# Patient Record
Sex: Male | Born: 1995 | Race: Black or African American | Hispanic: No | Marital: Single | Smoking: Former smoker
Health system: Southern US, Community
[De-identification: ages and names within clinical notes are randomized; demographics above are authoritative.]

## PROBLEM LIST (undated history)

## (undated) DIAGNOSIS — S43005D Unspecified dislocation of left shoulder joint, subsequent encounter: Secondary | ICD-10-CM

---
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
## 2017-04-10 ENCOUNTER — Encounter: Payer: Self-pay | Admitting: Emergency Medicine

## 2017-04-10 ENCOUNTER — Emergency Department
Admission: EM | Admit: 2017-04-10 | Discharge: 2017-04-10 | Disposition: A | Discharge status: Home / Self Care | Payer: BLUE CROSS/BLUE SHIELD | Attending: Emergency Medicine | Admitting: Emergency Medicine

## 2017-04-10 ENCOUNTER — Emergency Department: Payer: BLUE CROSS/BLUE SHIELD

## 2017-04-10 DIAGNOSIS — T1490XA Injury, unspecified, initial encounter: Secondary | ICD-10-CM

## 2017-04-10 DIAGNOSIS — Y929 Unspecified place or not applicable: Secondary | ICD-10-CM | POA: Insufficient documentation

## 2017-04-10 DIAGNOSIS — Y999 Unspecified external cause status: Secondary | ICD-10-CM | POA: Diagnosis not present

## 2017-04-10 DIAGNOSIS — X58XXXA Exposure to other specified factors, initial encounter: Secondary | ICD-10-CM | POA: Diagnosis not present

## 2017-04-10 DIAGNOSIS — S43015A Anterior dislocation of left humerus, initial encounter: Secondary | ICD-10-CM | POA: Diagnosis not present

## 2017-04-10 DIAGNOSIS — Y9367 Activity, basketball: Secondary | ICD-10-CM | POA: Insufficient documentation

## 2017-04-10 DIAGNOSIS — M21822 Other specified acquired deformities of left upper arm: Secondary | ICD-10-CM

## 2017-04-10 DIAGNOSIS — Z87891 Personal history of nicotine dependence: Secondary | ICD-10-CM | POA: Diagnosis not present

## 2017-04-10 DIAGNOSIS — S4992XA Unspecified injury of left shoulder and upper arm, initial encounter: Secondary | ICD-10-CM | POA: Diagnosis present

## 2017-04-10 DIAGNOSIS — S43005A Unspecified dislocation of left shoulder joint, initial encounter: Secondary | ICD-10-CM

## 2017-04-10 HISTORY — DX: Unspecified dislocation of left shoulder joint, subsequent encounter: S43.005D

## 2017-04-10 MED ORDER — SODIUM CHLORIDE 0.9 % IV BOLUS (SEPSIS)
1000.0000 mL | Freq: Once | INTRAVENOUS | Status: AC
Start: 2017-04-10 — End: 2017-04-10
  Administered 2017-04-10: 1000 mL via INTRAVENOUS

## 2017-04-10 MED ORDER — LIDOCAINE HCL (PF) 1 % IJ SOLN
30.0000 mL | Freq: Once | INTRAMUSCULAR | Status: DC
  Filled 2017-04-10: qty 30

## 2017-04-10 MED ORDER — HYDROMORPHONE HCL 1 MG/ML IJ SOLN
2.0000 mg | Freq: Once | INTRAMUSCULAR | Status: AC
  Administered 2017-04-10: 2 mg via INTRAMUSCULAR
  Filled 2017-04-10: qty 2

## 2017-04-10 MED ORDER — ONDANSETRON HCL 4 MG/2ML IJ SOLN
INTRAMUSCULAR | Status: AC
  Filled 2017-04-10: qty 2

## 2017-04-10 MED ORDER — PROPOFOL 1000 MG/100ML IV EMUL
INTRAVENOUS | Status: AC
  Filled 2017-04-10: qty 100

## 2017-04-10 MED ORDER — PROPOFOL 10 MG/ML IV BOLUS
INTRAVENOUS | Status: AC | PRN
Start: 2017-04-10 — End: 2017-04-10
  Administered 2017-04-10 (×2): 80 mg via INTRAVENOUS

## 2017-04-10 MED ORDER — MORPHINE SULFATE (PF) 4 MG/ML IV SOLN
INTRAVENOUS | Status: AC
  Filled 2017-04-10: qty 1

## 2017-04-10 MED ORDER — PROPOFOL 10 MG/ML IV BOLUS: 1.0000 mg/kg | Freq: Once | INTRAVENOUS | Status: DC
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
## 2017-04-10 NOTE — ED Provider Notes (Signed)
[facility] Emergency Department Provider Note  ____________________________________________   First MD Initiated Contact with Patient 04/10/17 1605     (approximate)  I have reviewed the triage vital signs and the nursing notes.   HISTORY  Chief Complaint Shoulder Pain    HPI Casey Byrd is a 21 y.o. male who comes to the emergency department with severe stabbing aching nonradiating left shoulder pain for the past hour after playing basketball feeling his left shoulder fall out of joint. 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 He is right-hand dominant. He denies numbness or weakness.   Past Medical History:  Diagnosis Date  . Shoulder dislocation, left, subsequent encounter     There are no active problems to display for this patient.   Past Surgical History:  Procedure Laterality Date  . SHOULDER CLOSED REDUCTION Left 09/26/2016   Procedure: CLOSED REDUCTION SHOULDER;  Surgeon: Deeann Saint, MD;  Location: [facility] ORS;  Service: Orthopedics;  Laterality: Left;    Prior to Admission medications   Medication Sig Start Date End Date Taking? Authorizing Provider  HYDROcodone-acetaminophen (NORCO) 7.5-325 MG tablet Take 1 tablet by mouth every 6 (six) hours as needed for moderate pain. 09/26/16   Deeann Saint, MD    Allergies Patient has no known allergies.  No family history on file.  Social History Social History  Substance Use Topics  . Smoking status: Former Games developer  . Smokeless tobacco: Never Used  . Alcohol use No    Review of Systems Constitutional: No fever/chills Eyes: No visual changes. ENT: No sore throat. Cardiovascular: Denies chest pain. Respiratory: Denies shortness of breath. Gastrointestinal: No abdominal pain.  No nausea, no vomiting.  No diarrhea.  No constipation. Genitourinary: Negative for dysuria. Musculoskeletal: Negative for back pain. Skin:  Negative for rash. Neurological: Negative for headaches, focal weakness or numbness.  10-point ROS otherwise negative.  ____________________________________________   PHYSICAL EXAM:  VITAL SIGNS: ED Triage Vitals  Enc Vitals Group     BP 04/10/17 1558 (!) 147/89     Pulse Rate 04/10/17 1558 77     Resp 04/10/17 1558 20     Temp 04/10/17 1558 98.8 F (37.1 C)     Temp Source 04/10/17 1558 Oral     SpO2 04/10/17 1558 100 %     Weight 04/10/17 1557 180 lb (81.6 kg)     Height 04/10/17 1557  (1.753 m)     Head Circumference --      Peak Flow --      Pain Score 04/10/17 1557 8     Pain Loc --      Pain Edu? --      Excl. in GC? --     Constitutional: Alert and oriented x 4 Appears uncomfortable with obvious deformity to his left shoulder grimacing Eyes: PERRL EOMI. Head: Atraumatic. Nose: No congestion/rhinnorhea. Mouth/Throat: No trismus Neck: No stridor.   Cardiovascular: Normal rate, regular rhythm. Grossly normal heart sounds.  Good peripheral circulation. Respiratory: Normal respiratory effort.  No retractions. Lungs CTAB and moving good air Musculoskeletal: Left shoulder held internally rotated with obvious defect at the shoulder normal sensation of her axial patch radial median and ulnar nerves are intact 2+ radial pulse and compartments are soft Neurologic:  Normal speech and language. No gross focal neurologic deficits are appreciated. Skin:  Skin is warm, dry and intact. No rash noted. Psychiatric: Mood and affect are normal. Speech and  behavior are normal.    ____________________________________________  ____________________________________________   LABS (all labs ordered are listed, but only abnormal results are displayed)  Labs Reviewed - No data to display   __________________________________________  EKG   ____________________________________________  RADIOLOGY  First x-ray showing anterior shoulder dislocation second x-ray showing  successful reduction ____________________________________________   PROCEDURES  Procedure(s) performed: yes  Procedural sedation Performed by: Merrily Brittle Consent: Verbal consent obtained. Risks and benefits: risks, benefits and alternatives were discussed Required items: required blood products, implants, devices, and special equipment available Patient identity confirmed: arm band and provided demographic data Time out: Immediately prior to procedure a "time out" was called to verify the correct patient, procedure, equipment, support staff and site/side marked as required.  Sedation type: moderate (conscious) sedation NPO time confirmed and considedered  Sedatives: PROPOFOL  Physician Time at Bedside: 30 min  Vitals: Vital signs were monitored during sedation. Cardiac Monitor, pulse oximeter Patient tolerance: Patient tolerated the procedure well with no immediate complications. Comments: Pt with uneventful recovered. Returned to pre-procedural sedation baseline   Reduction of dislocation Date/Time: 8:20 PM Performed by: Merrily Brittle Authorized by: Merrily Brittle Consent: Verbal consent obtained. Risks and benefits: risks, benefits and alternatives were discussed Consent given by: patient Required items: required blood products, implants, devices, and special equipment available Time out: Immediately prior to procedure a "time out" was called to verify the correct patient, procedure, equipment, support staff and site/side marked as required.  Patient sedated: With propofol  Vitals: Vital signs were monitored during sedation. Patient tolerance: Patient tolerated the procedure well with no immediate complications. Joint: Left shoulder Reduction technique: External rotation      Procedures  Critical Care performed: no  ____________________________________________   INITIAL IMPRESSION / ASSESSMENT AND PLAN / ED COURSE  Pertinent labs & imaging results that  were available during my care of the patient were reviewed by me and considered in my medical decision making (see chart for details).  By the time I saw the patient he had artery had an x-ray confirming an anterior left-sided shoulder dislocation. He is neurologically intact. Pain medications pending.    After 2 mg of intramuscular Dilaudid I attempted reduction of the patient's shoulder but he has significant muscle spasm and pain and I was unable to perform the procedure. I then consented the patient for intravenous procedural sedation and perform sedation with a total of 2 mg/kg of IV propofol and was able to successfully reduce his shoulder with external rotation. I then placed him in a shoulder immobilizer and the patient awoke from his sedation appropriately. He was neurovascularly intact following. I will give him follow-up with orthopedic surgery within one week for recheck. He is discharged home with his family to drive.  ____________________________________________   FINAL CLINICAL IMPRESSION(S) / ED DIAGNOSES  Final diagnoses:  Shoulder dislocation, left, initial encounter      NEW MEDICATIONS STARTED DURING THIS VISIT:  New Prescriptions   No medications on file     Note:  This document was prepared using Dragon voice recognition software and may include unintentional dictation errors.     Merrily Brittle, MD 04/10/17 2022

## 2017-04-10 NOTE — ED Notes (Signed)
Pharmacy called to send up Xylocaine

## 2017-04-10 NOTE — Discharge Instructions (Signed)
Please make an appointment to follow up with orthopedics within one week for reexamination. Wear your shoulder immobilizer at all times until you are cleared by an orthopedic surgeon because you're at high risk for re-dislocating your shoulder.  Return to the emergency department for any concerns.  It was a pleasure to take care of you today, and thank you for coming to our emergency department.  If you have any questions or concerns before leaving please ask the nurse to grab me and I'm more than happy to go through your aftercare instructions again.  If you were prescribed any opioid pain medication today such as Norco, Vicodin, Percocet, morphine, hydrocodone, or oxycodone please make sure you do not drive when you are taking this medication as it can alter your ability to drive safely.  If you have any concerns once you are home that you are not improving or are in fact getting worse before you can make it to your follow-up appointment, please do not hesitate to call 911 and come back for further evaluation.  Merrily Brittle MD  No results found for this or any previous visit. Dg Shoulder Left  Result Date: 04/10/2017 CLINICAL DATA:  LEFT shoulder popped out of place when playing basketball in jumped up for rebound, prior dislocation of the LEFT shoulder EXAM: LEFT SHOULDER - 2+ VIEW COMPARISON:  09/26/2016 FINDINGS: Anterior LEFT glenohumeral dislocation, recurrent. No definite fractures identified ; Hill-Sachs impaction seen on aprevious study is not definitely visualized on current exam. AC joint alignment normal. Visualized LEFT ribs intact. Osseous mineralization normal. IMPRESSION: Anterior LEFT glenohumeral dislocation. Electronically Signed   By: Ulyses Southward M.D.   On: 04/10/2017 16:19   Dg Shoulder Left Portable  Result Date: 04/10/2017 CLINICAL DATA:  Reduction of shoulder dislocation EXAM: LEFT SHOULDER - 1 VIEW COMPARISON:  None. FINDINGS: The humeral head articulates with the  glenoid and has been reduced. Large Hill-Sachs deformity of the superolateral humeral head. No bony Bankart lesion. AC joint is maintained. The adjacent ribs and lung are unremarkable. Mild soft tissue swelling in the region of the deltoid. IMPRESSION: 1. Reduction of humeral head dislocation now anatomically situated. 2. Hill-Sachs deformity of the humeral head. Electronically Signed   By: Tollie Eth M.D.   On: 04/10/2017 18:27

## 2017-07-21 IMAGING — DX DG SHOULDER 1V*L*
3 series · 3 of 3 positions shown · non-contrast
Comparison: None.

CLINICAL DATA: Reduction of shoulder dislocation

EXAM:
LEFT SHOULDER - 1 VIEW

[shoulder ap (1 of 3)]
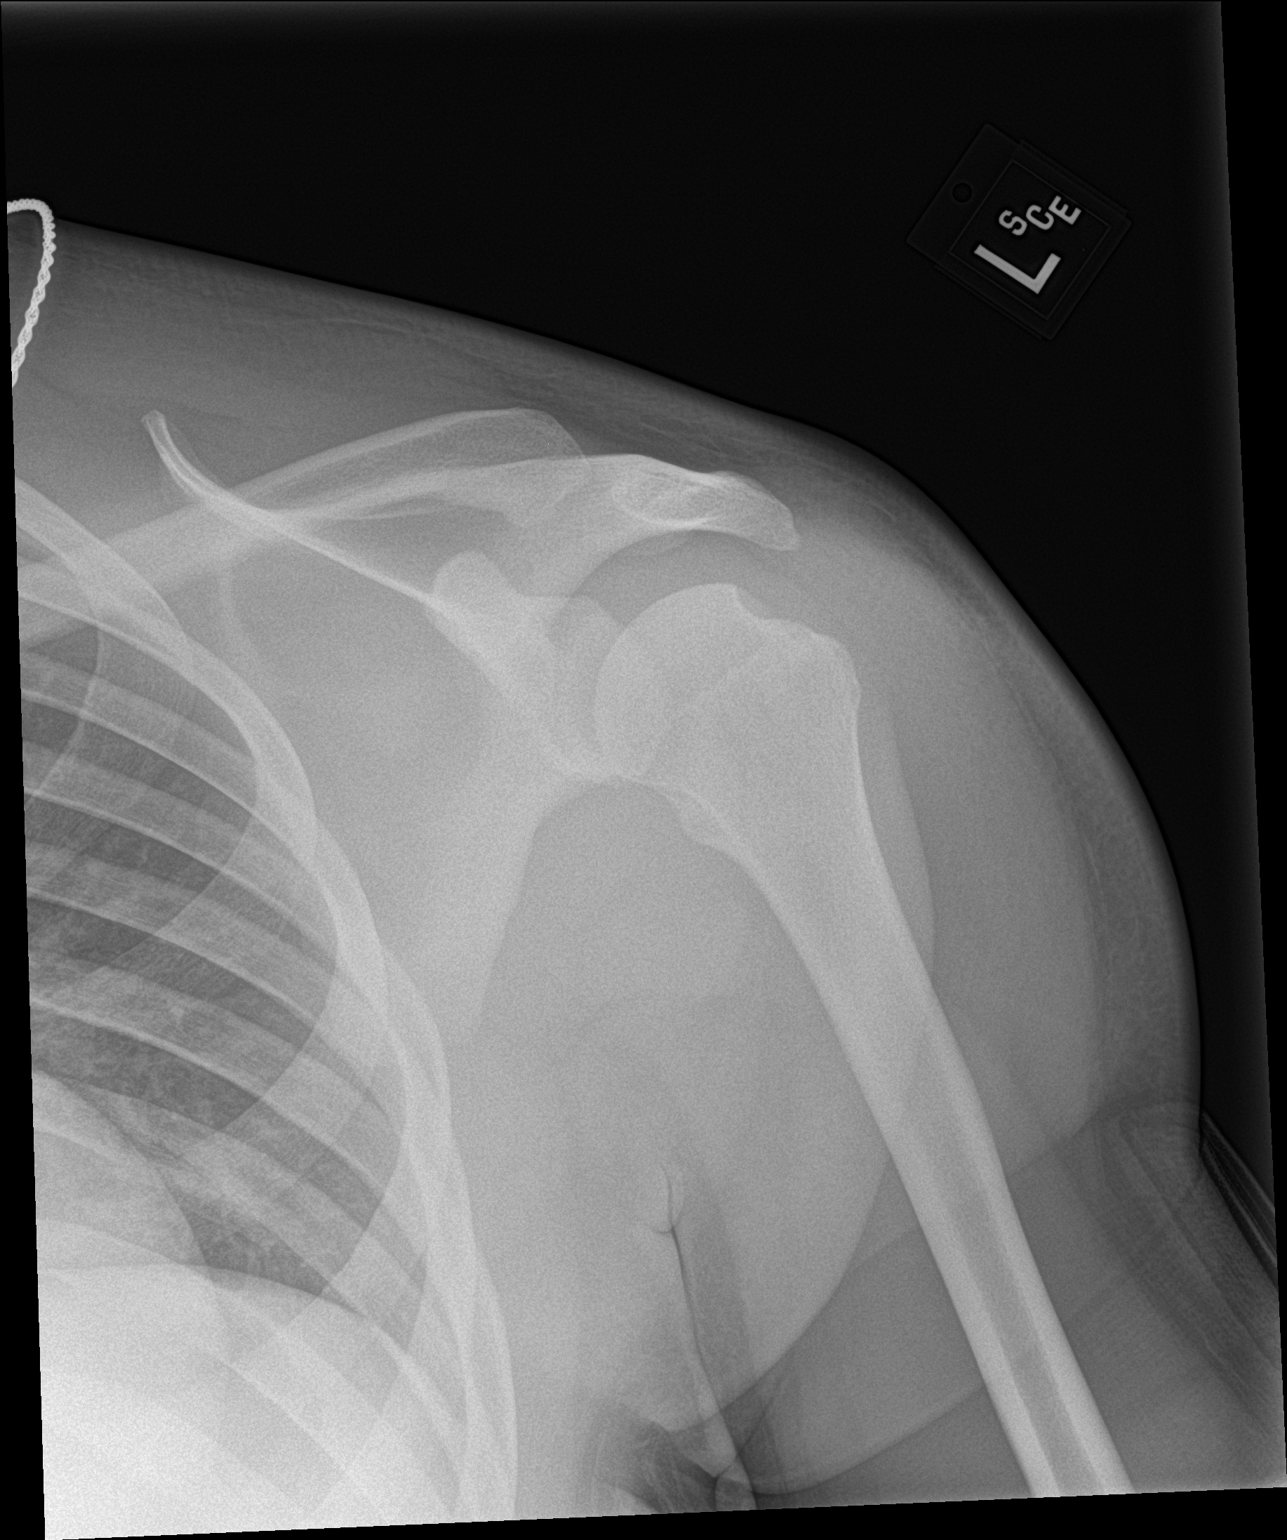

[shoulder ap (2 of 3)]
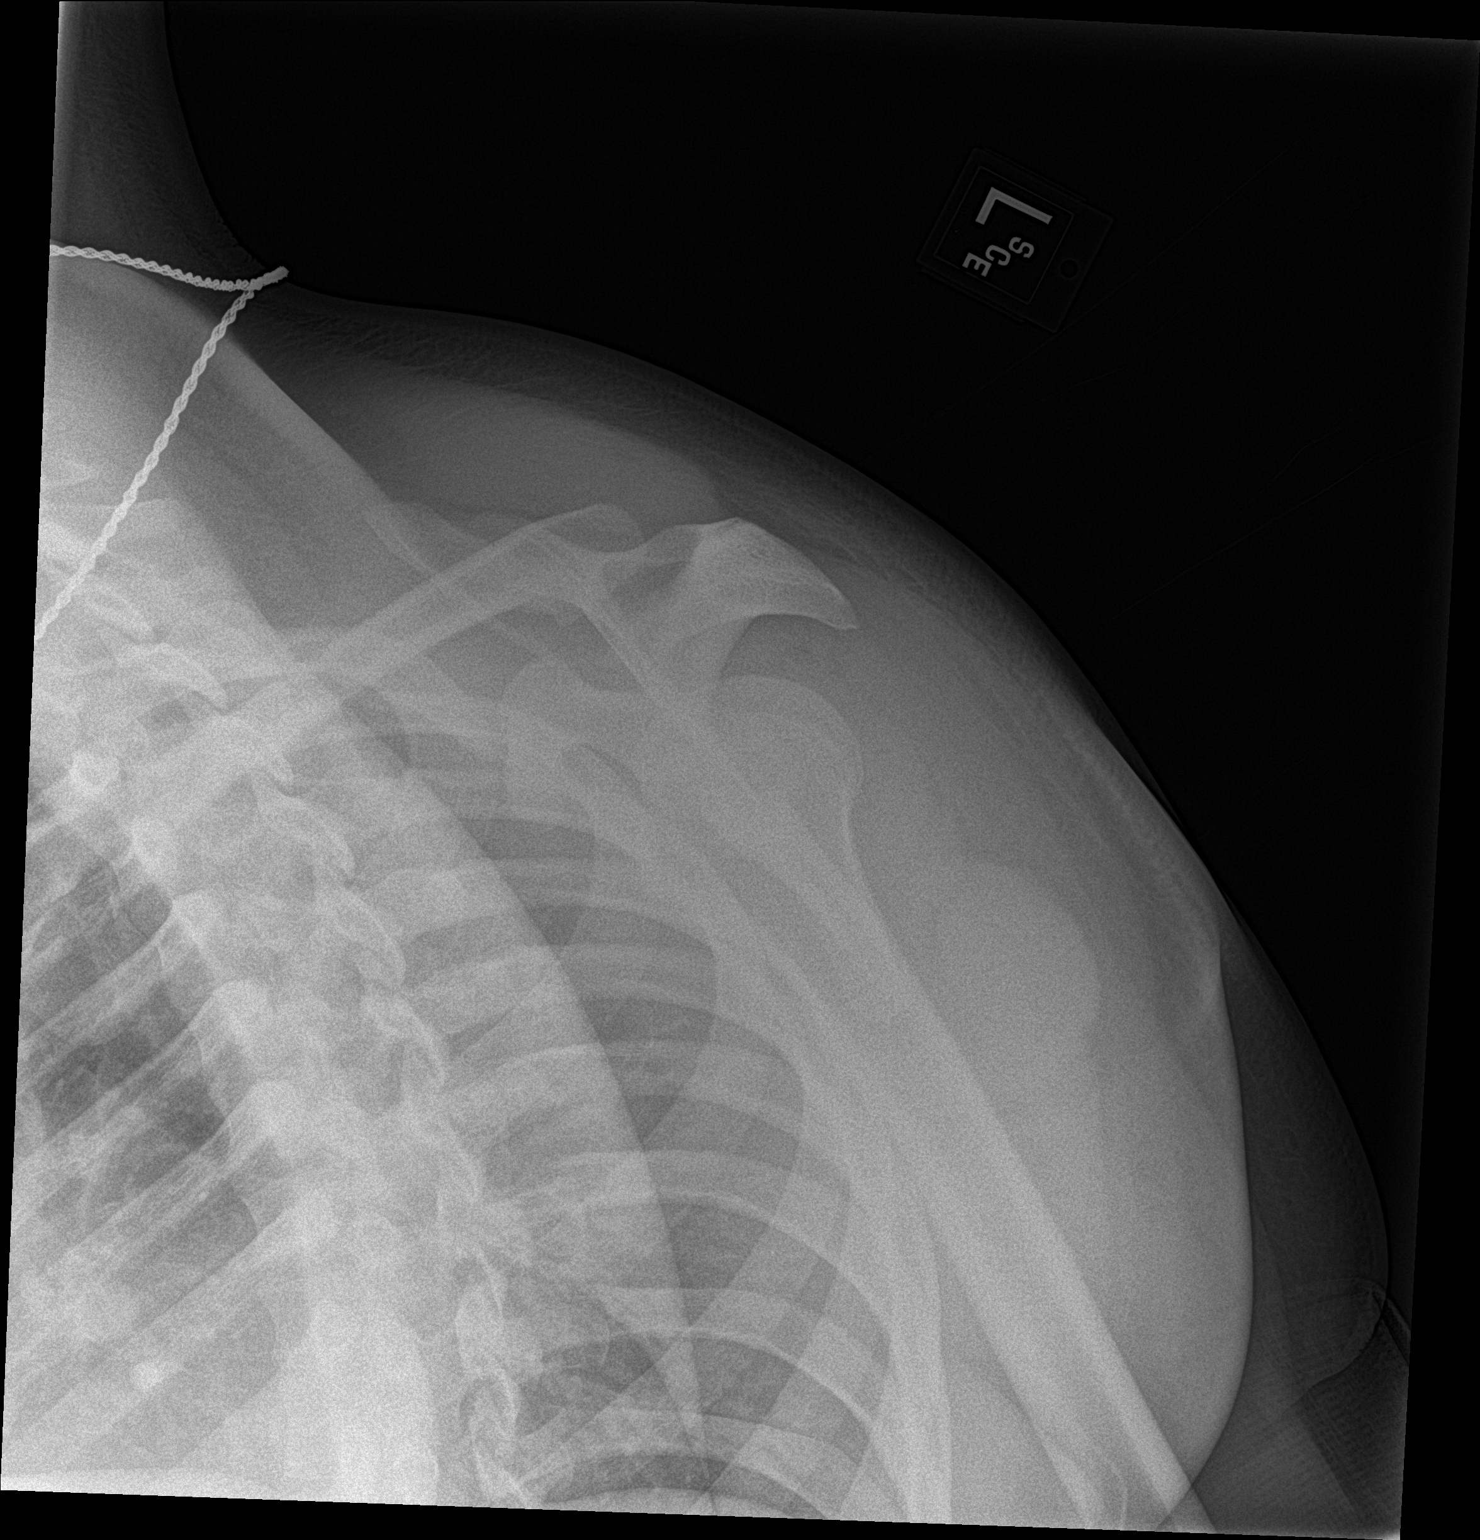

[shoulder ap (3 of 3)]
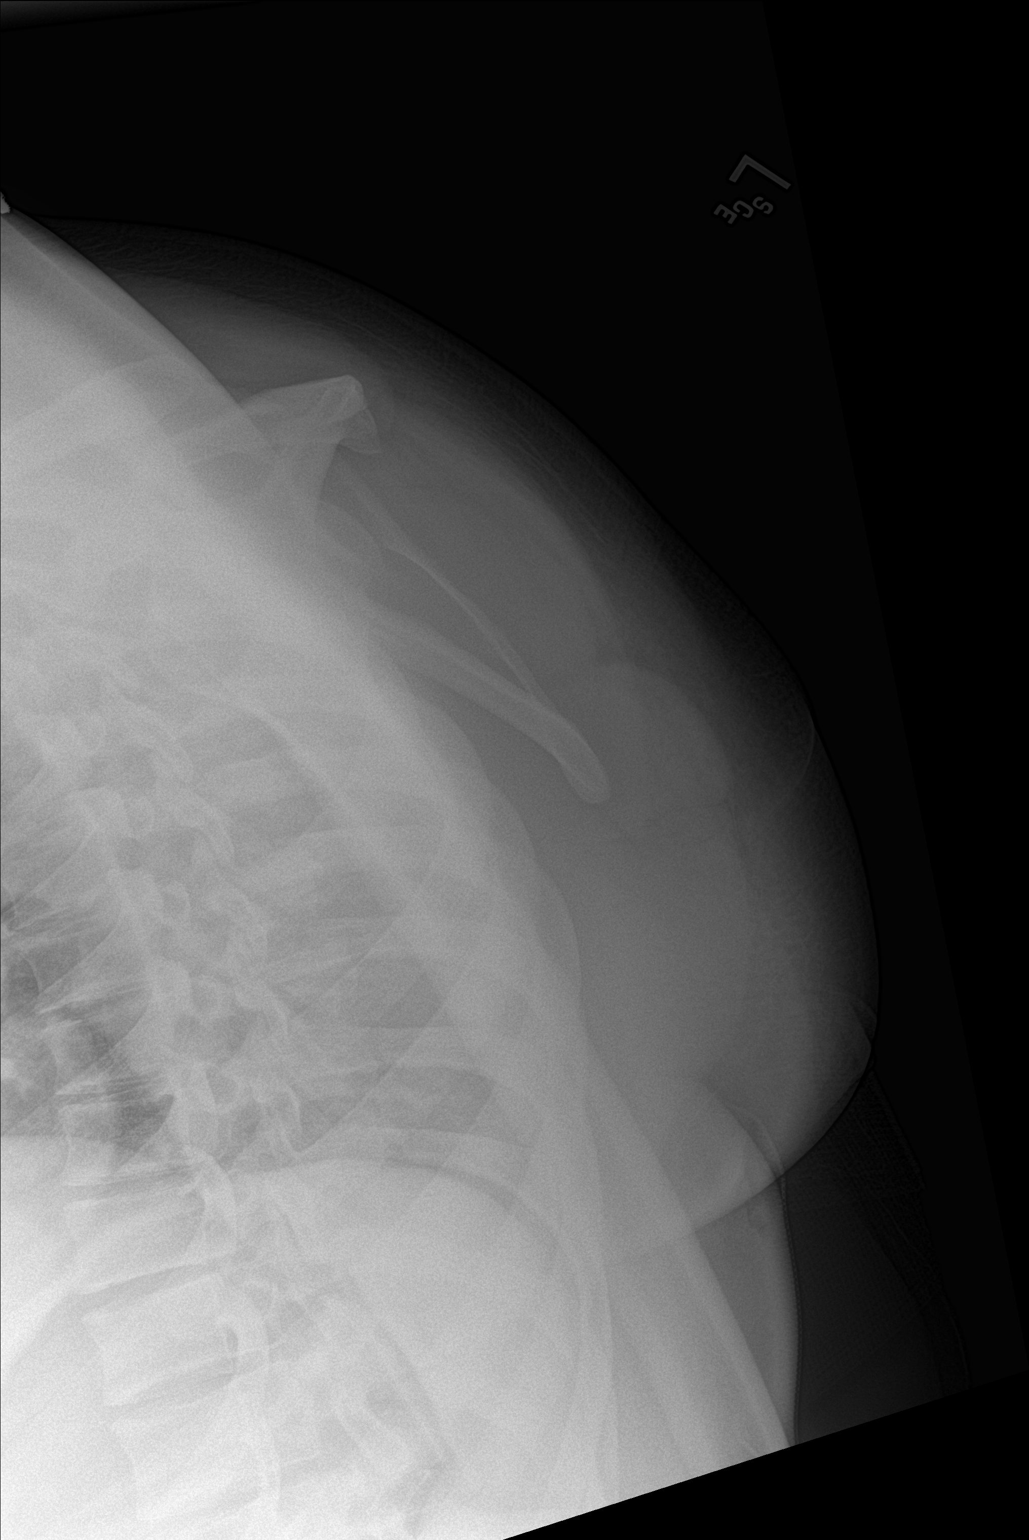

[3 of 3 positions shown; findings below may reference images not displayed]

FINDINGS: The humeral head articulates with the glenoid and has been reduced.
Large Hill-Sachs deformity of the superolateral humeral head. No
bony Bankart lesion. AC joint is maintained. The adjacent ribs and
lung are unremarkable. Mild soft tissue swelling in the region of
the deltoid.
IMPRESSION: 1. Reduction of humeral head dislocation now anatomically situated.
2. Hill-Sachs deformity of the humeral head.

## 2019-02-27 DEATH — deceased
